# Patient Record
Sex: Female | Born: 2000 | Race: White | Hispanic: No | Marital: Single | Smoking: Never smoker
Health system: Southern US, Community
[De-identification: ages and names within clinical notes are randomized; demographics above are authoritative.]

## PROBLEM LIST (undated history)

## (undated) DIAGNOSIS — R7989 Other specified abnormal findings of blood chemistry: Secondary | ICD-10-CM

## (undated) DIAGNOSIS — N83201 Unspecified ovarian cyst, right side: Secondary | ICD-10-CM

## (undated) HISTORY — PX: NO PAST SURGERIES: SHX2092

---
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
## 2020-01-14 ENCOUNTER — Other Ambulatory Visit: Payer: Self-pay

## 2020-01-14 ENCOUNTER — Encounter (HOSPITAL_BASED_OUTPATIENT_CLINIC_OR_DEPARTMENT_OTHER): Payer: Self-pay | Admitting: Obstetrics and Gynecology

## 2020-01-14 NOTE — Progress Notes (Signed)
Spoke w/ via phone for pre-op interview---pt Lab needs dos----   t & s, urine preg            Lab results------none COVID test ------positive covid test 12-27-2019 no retest needed Arrive at -------530 am 01-18-2020 NPO after MN NO Solid Food.  Clear liquids from MN until---430 am then npo Medications to take morning of surgery ----none- Diabetic medication -----n/a Patient Special Instructions -----none Pre-Op special Istructions -----none Patient verbalized understanding of instructions that were given at this phone interview. Patient denies shortness of breath, chest pain, fever, cough at this phone interview.

## 2020-01-18 ENCOUNTER — Ambulatory Visit (HOSPITAL_BASED_OUTPATIENT_CLINIC_OR_DEPARTMENT_OTHER)
Admission: RE | Admit: 2020-01-18 | Payer: Managed Care, Other (non HMO) | Source: Home / Self Care | Admitting: Obstetrics and Gynecology

## 2020-01-18 HISTORY — DX: Unspecified ovarian cyst, right side: N83.201

## 2020-01-18 HISTORY — DX: Other specified abnormal findings of blood chemistry: R79.89

## 2020-01-18 SURGERY — EXCISION, CYST, OVARY, LAPAROSCOPIC
Anesthesia: Choice | Laterality: Right
